# Patient Record
Sex: Female | Born: 1935 | Race: White | Hispanic: No | Marital: Married
Health system: Southern US, Community
[De-identification: ages and names within clinical notes are randomized; demographics above are authoritative.]

---

## 2004-07-23 ENCOUNTER — Ambulatory Visit: Payer: Self-pay | Admitting: Internal Medicine

## 2005-07-08 ENCOUNTER — Ambulatory Visit: Payer: Self-pay | Admitting: Ophthalmology

## 2005-07-15 ENCOUNTER — Ambulatory Visit: Payer: Self-pay | Admitting: Ophthalmology

## 2005-10-09 ENCOUNTER — Ambulatory Visit: Payer: Self-pay | Admitting: Internal Medicine

## 2006-09-02 ENCOUNTER — Other Ambulatory Visit: Payer: Self-pay

## 2006-09-03 ENCOUNTER — Inpatient Hospital Stay: Payer: Self-pay | Admitting: Internal Medicine

## 2008-01-04 IMAGING — CT CT HEAD WITHOUT CONTRAST
2 series · 16 of 30 positions shown, 20 images · non-contrast
Comparison: none

REASON FOR EXAM: frequent falls
COMMENTS:

[Series 2: without · axial · non-contrast · 0.38mm/px · z∈[+884,+1018]mm · 13 of 33 slices shown, 17 images]
[im 3/33  brain]
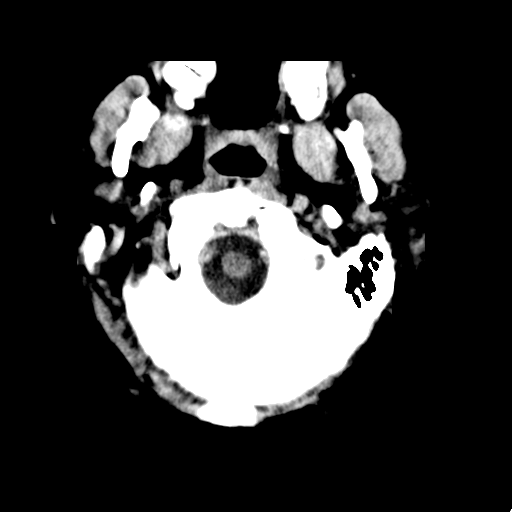
[im 3/33  bone]
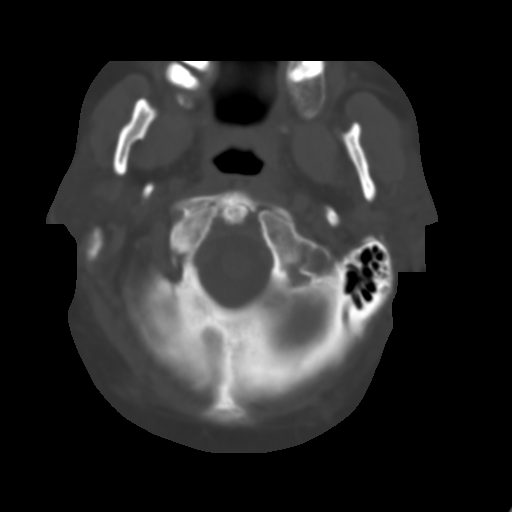
[im 5/33  brain]
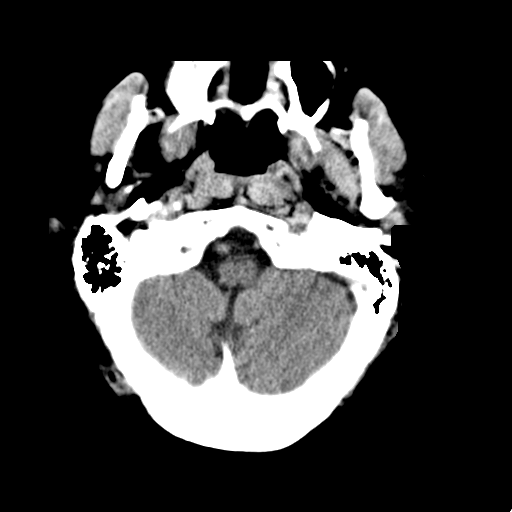
[im 7/33  brain]
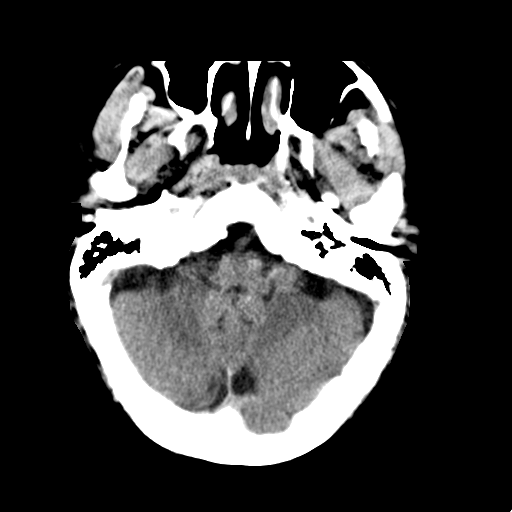
[im 10/33  brain]
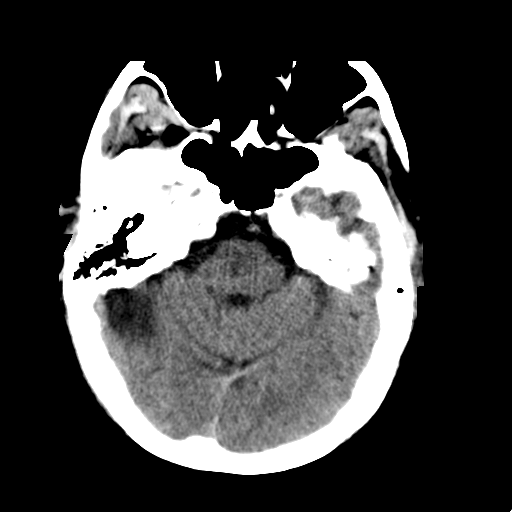
[im 12/33  brain]
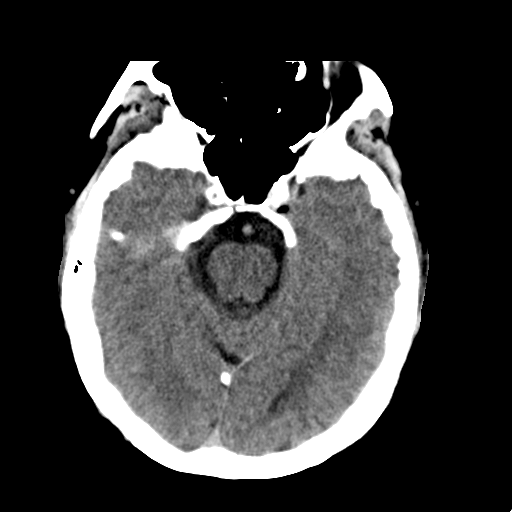
[im 12/33  bone]
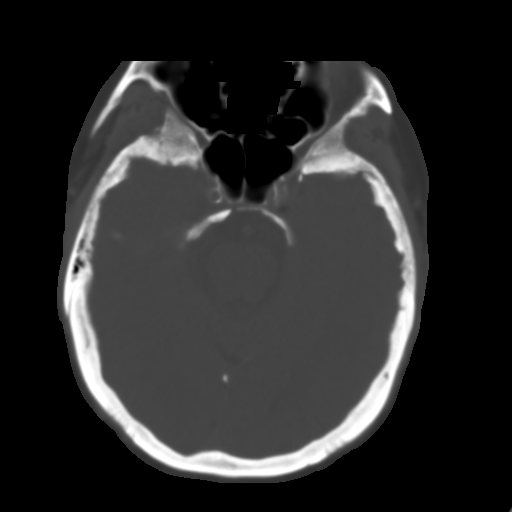
[im 14/33  brain]
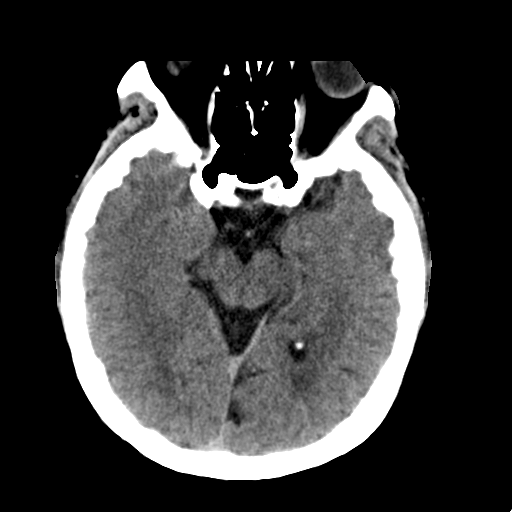
[im 17/33  brain]
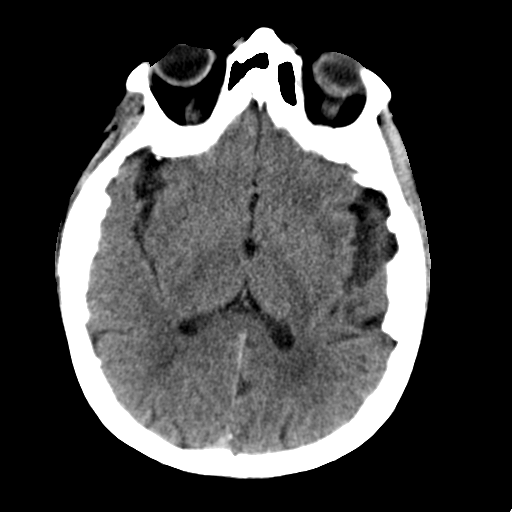
[im 19/33  brain]
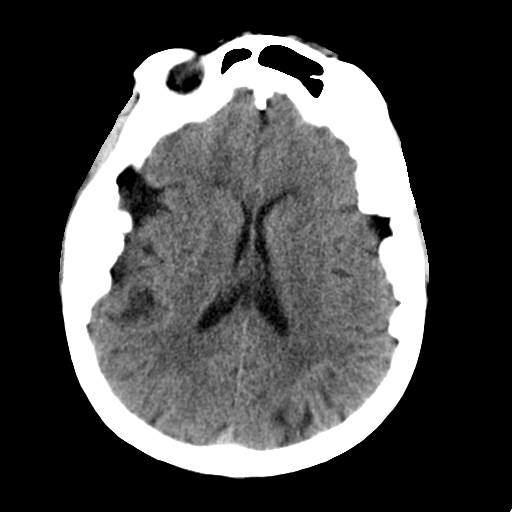
[im 21/33  brain]
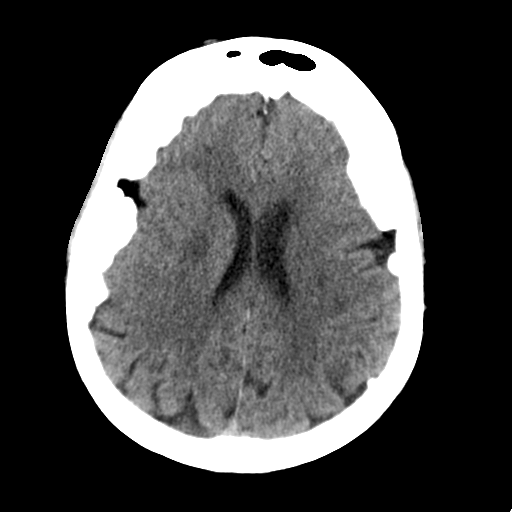
[im 21/33  bone]
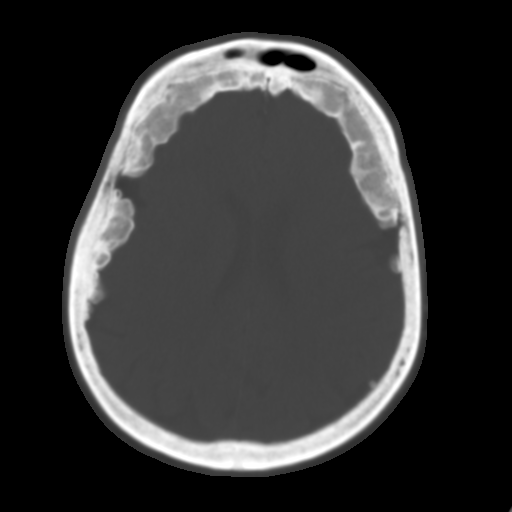
[im 23/33  brain]
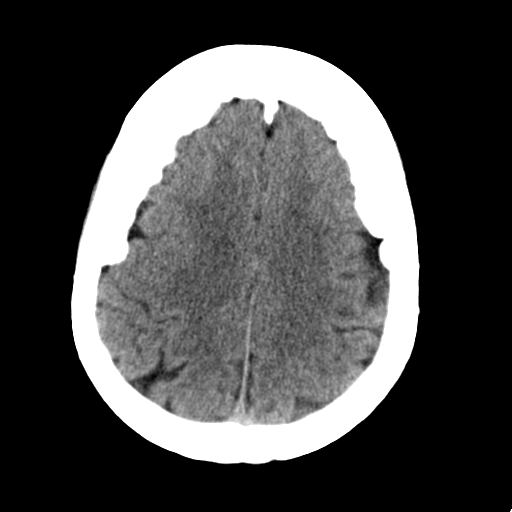
[im 26/33  brain]
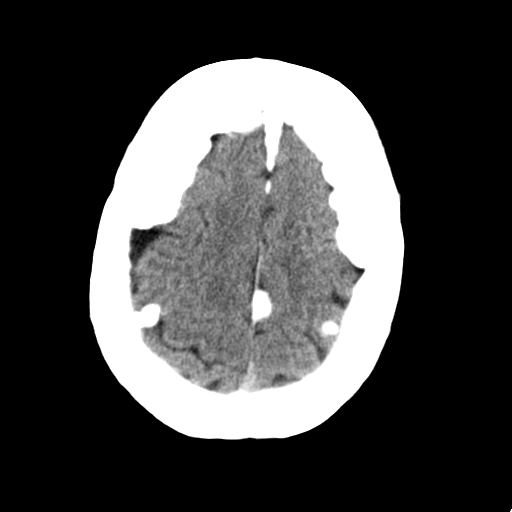
[im 28/33  brain]
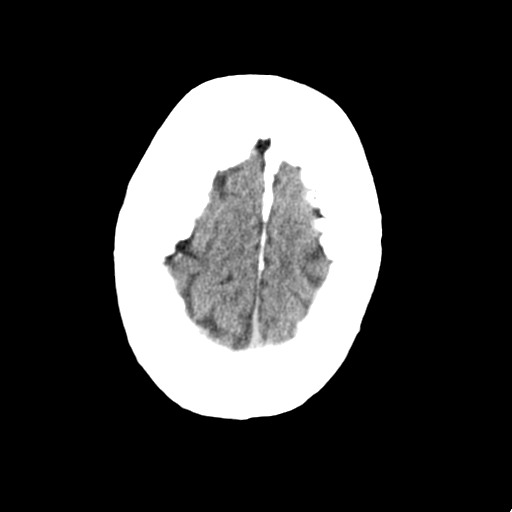
[im 30/33  brain]
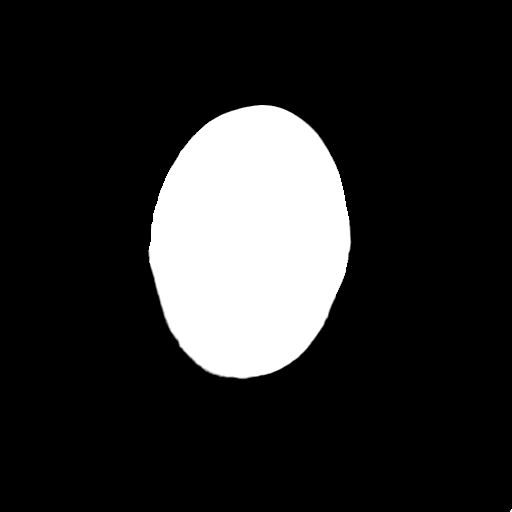
[im 30/33  bone]
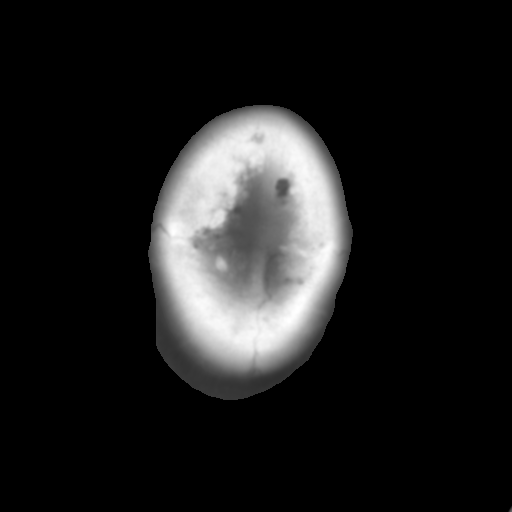

[Series 3: bone · axial · 0.38mm/px · z∈[+884,+928]mm · 3 of 33 slices shown]
[im 3/33  bone]
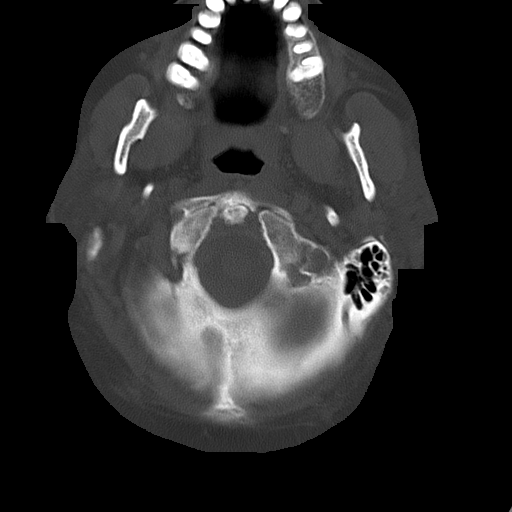
[im 7/33  bone]
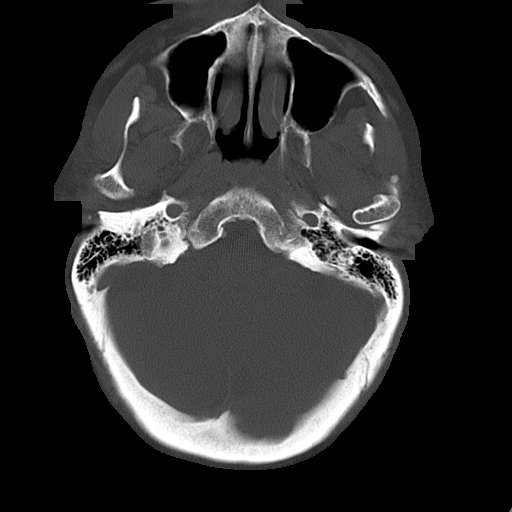
[im 12/33  bone]
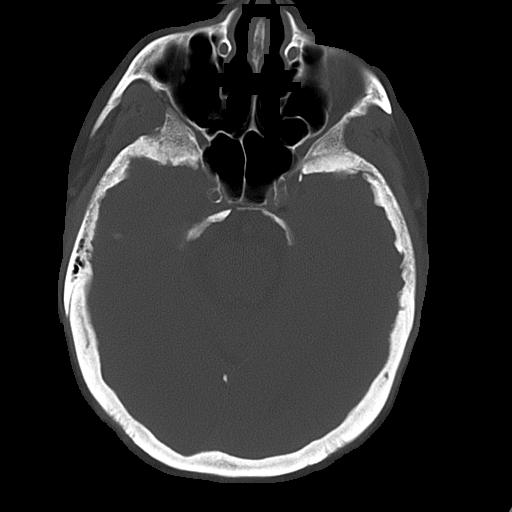

[16 of 30 positions shown; findings below may reference images not displayed]

PROCEDURE:     CT  - CT HEAD WITHOUT CONTRAST  - September 02, 2006  [DATE]

RESULT:     There is moderate diffuse age-appropriate cerebral and
cerebellar atrophy. Deep white matter hypodensity is present consistent with
chronic small vessel ischemic change of aging. There is hyperostosis of the
frontal bone. The paranasal sinuses exhibit no air-fluid levels.
IMPRESSION: 1. There is extensive chronic changes present within the brain.
2. I do not see evidence of an acute ischemic or hemorrhagic event. There is
no evidence of an acute skull fracture.

A preliminary report was sent to the [HOSPITAL] the conclusion
of the study.

## 2008-01-05 IMAGING — US US CAROTID DUPLEX BILAT
1 series · 17 of 24 positions shown · non-contrast
Comparison: none

REASON FOR EXAM: Pavlescu Caruntu
COMMENTS:

[Series 1: us carotid duplex bilat · 17 of 64 slices shown]
[im 1/64]
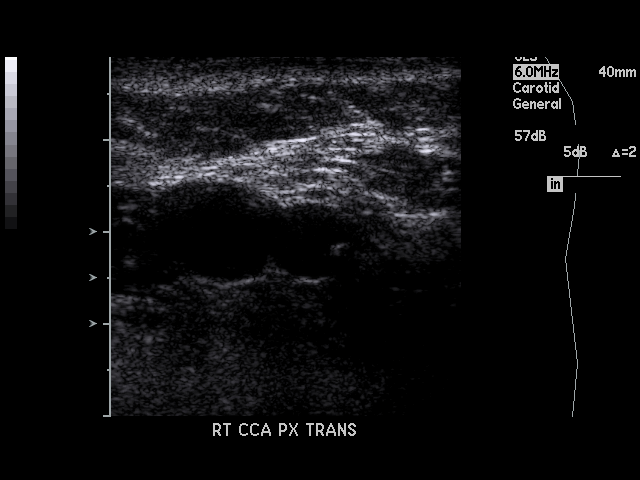
[im 6/64]
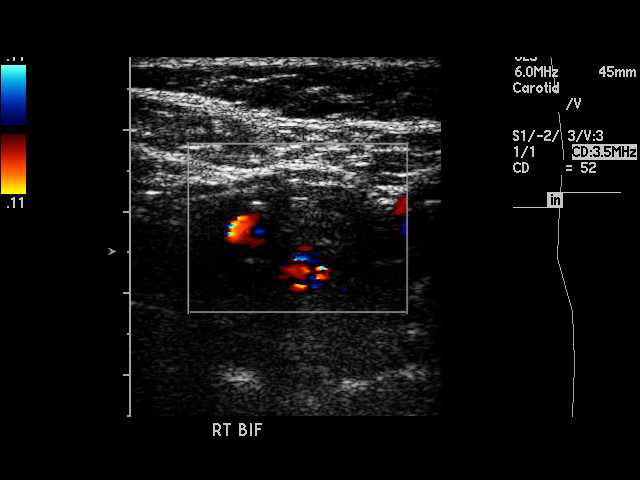
[im 9/64]
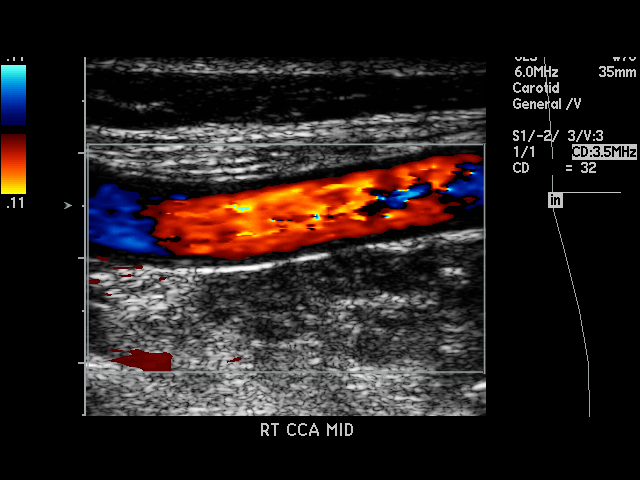
[im 11/64]
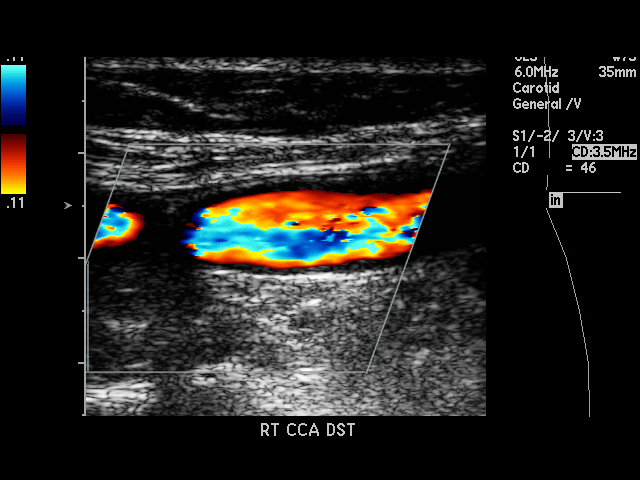
[im 17/64]
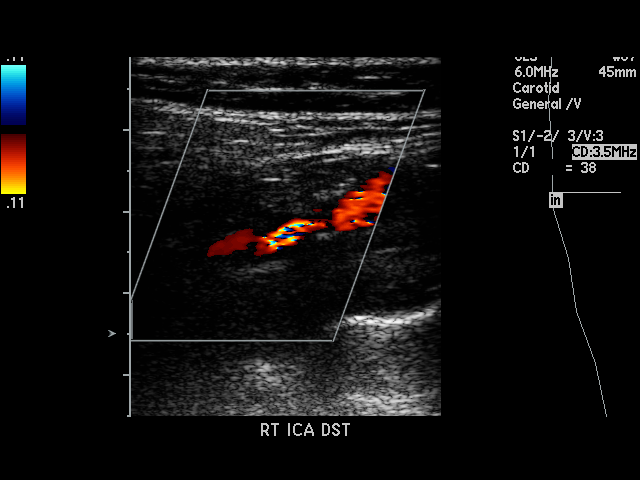
[im 20/64]
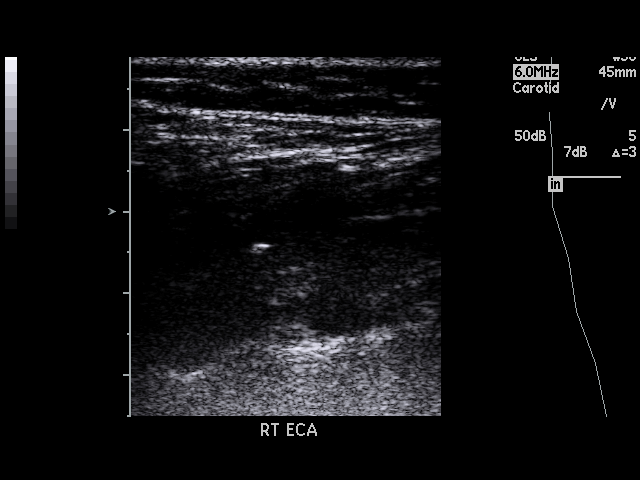
[im 25/64]
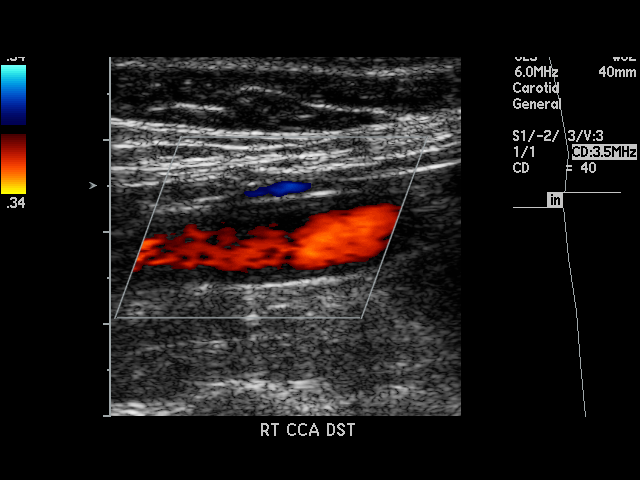
[im 28/64]
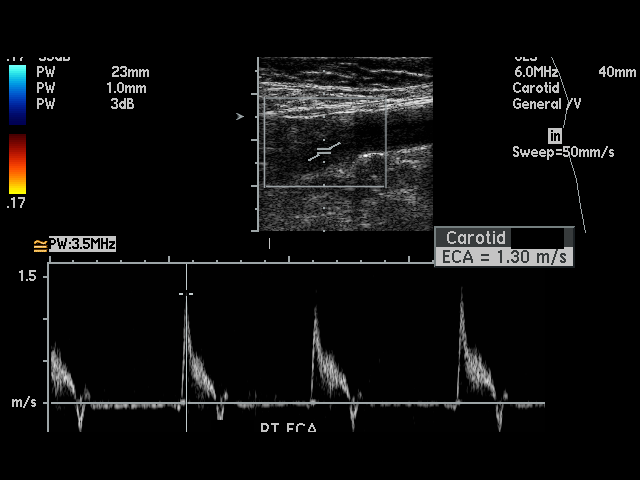
[im 33/64]
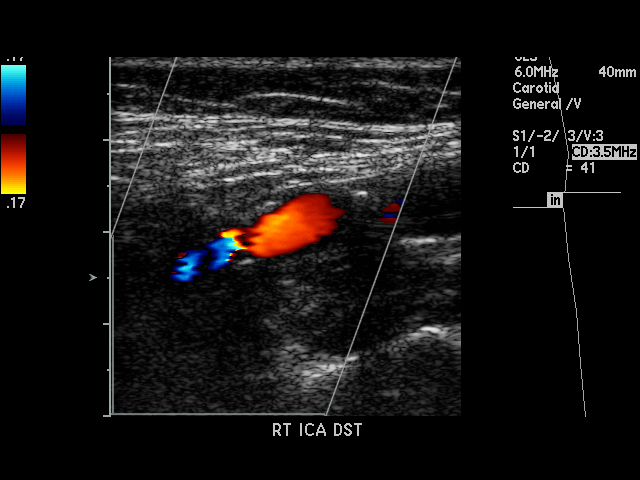
[im 36/64]
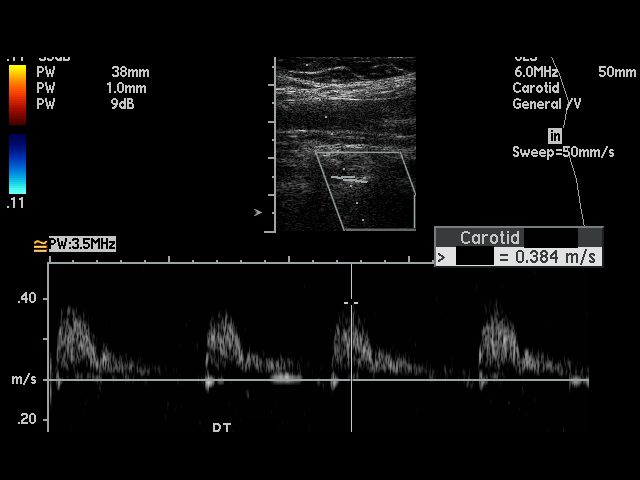
[im 39/64]
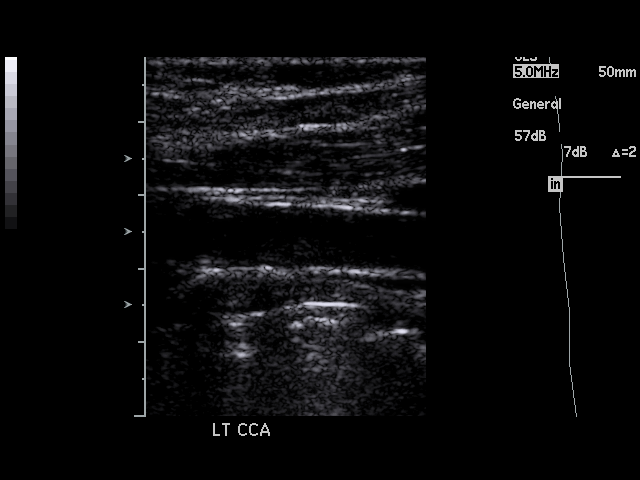
[im 44/64]
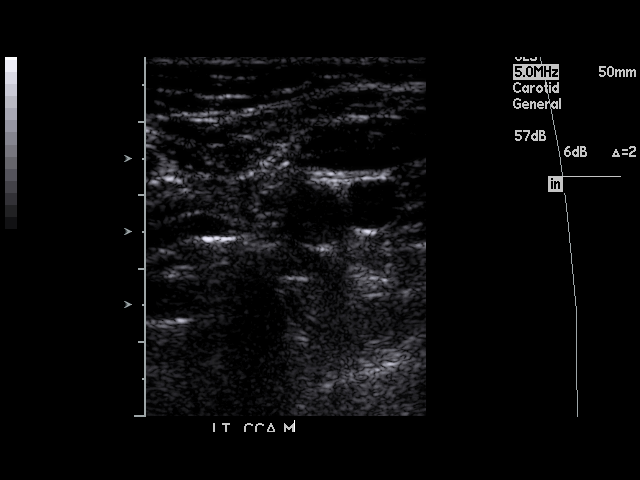
[im 47/64]
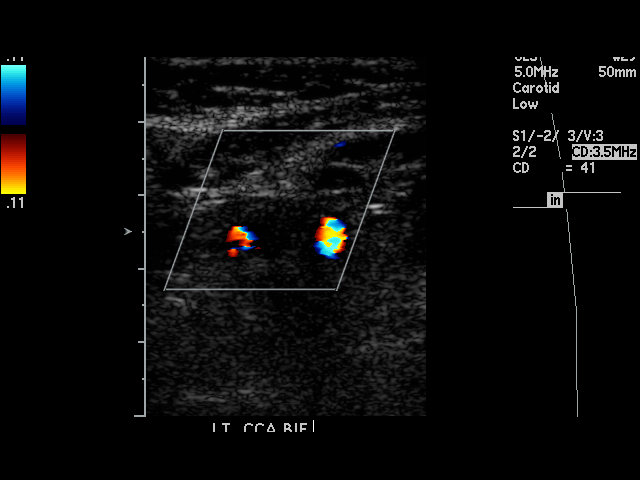
[im 53/64]
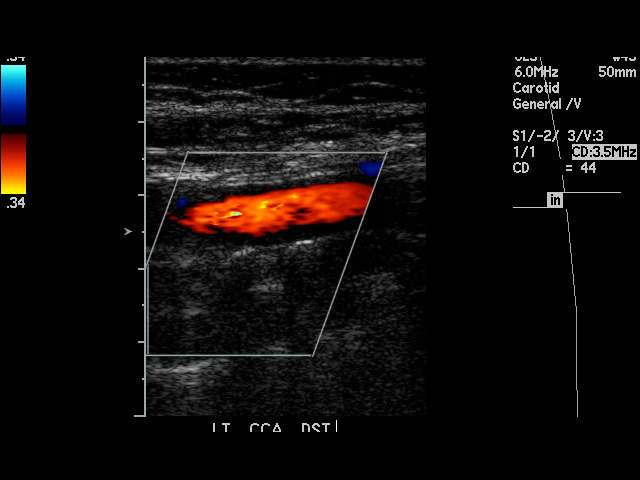
[im 55/64]
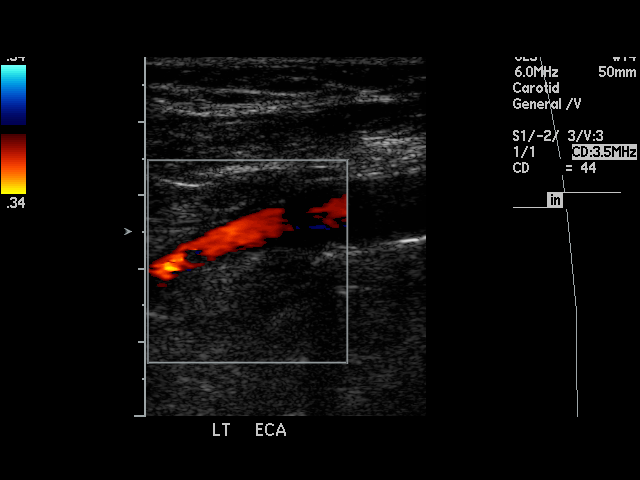
[im 58/64]
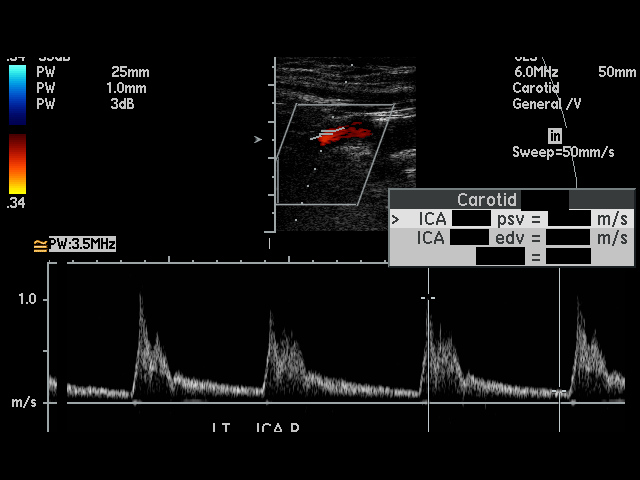
[im 64/64]
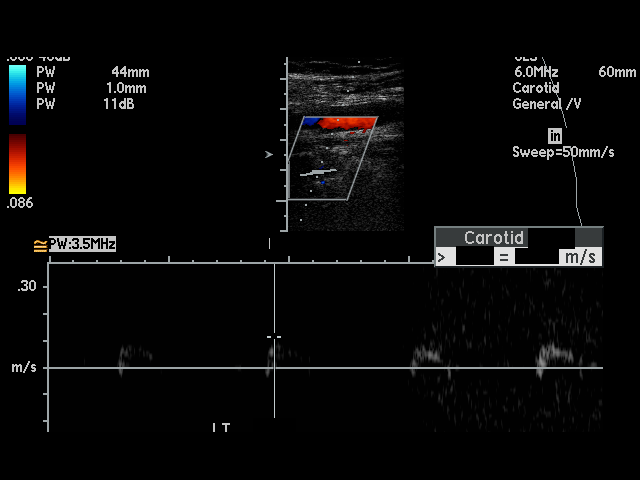

[17 of 24 positions shown; findings below may reference images not displayed]

PROCEDURE:     US  - US CAROTID DOPPLER BILATERAL  - September 03, 2006  [DATE]

RESULT:     Duplex Doppler interrogation of the carotid systems is performed
in the standard fashion. There is some calcified plaque present bilaterally
in the carotid systems. There is noncalcified plaque in the internal carotid
artery on the LEFT. The vertebral arteries demonstrate antegrade flow
bilaterally. The peak systolic velocities are normal bilaterally. The peak
systolic velocity ratio on the RIGHT is 1.39. The ratio on the LEFT is 0.93.
IMPRESSION: No evidence of hemodynamically significant stenosis. Antegrade flow in both
vertebral arteries.

## 2012-02-25 DEATH — deceased

## 2021-06-05 ENCOUNTER — Telehealth: Payer: Self-pay | Admitting: Obstetrics and Gynecology

## 2021-06-05 NOTE — Telephone Encounter (Signed)
Mrs. Sharon Nielsen , I just spoke with Mrs Sharon Nielsen and let her know you were working on this on her behalf( she just called back). She states Medicare told her the physical was coded as a routine visit verse physical and they could only cover it as a physical.  She would like you to call her back once those changes have been made. She is concerned over having a big bill. ?

## 2021-06-05 NOTE — Telephone Encounter (Signed)
Please disregard. Incorrect patient. ?
